# Patient Record
Sex: Male | Born: 1995 | Race: White | Hispanic: No | Marital: Single | Smoking: Current every day smoker
Health system: Southern US, Community
[De-identification: ages and names within clinical notes are randomized; demographics above are authoritative.]

## PROBLEM LIST (undated history)

## (undated) DIAGNOSIS — F32A Depression, unspecified: Secondary | ICD-10-CM

## (undated) DIAGNOSIS — F419 Anxiety disorder, unspecified: Secondary | ICD-10-CM

## (undated) HISTORY — DX: Depression, unspecified: F32.A

## (undated) HISTORY — DX: Anxiety disorder, unspecified: F41.9

---
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
## 2016-04-18 ENCOUNTER — Encounter: Payer: Self-pay | Admitting: Emergency Medicine

## 2016-04-18 ENCOUNTER — Emergency Department
Admission: EM | Admit: 2016-04-18 | Discharge: 2016-04-18 | Disposition: A | Discharge status: Home / Self Care | Payer: Self-pay | Attending: Student | Admitting: Student

## 2016-04-18 DIAGNOSIS — R21 Rash and other nonspecific skin eruption: Secondary | ICD-10-CM | POA: Insufficient documentation

## 2016-04-18 MED ORDER — TRIAMCINOLONE ACETONIDE 0.5 % EX OINT: 1.0000 "application " | TOPICAL_OINTMENT | Freq: Two times a day (BID) | CUTANEOUS | 0 refills | Status: DC

## 2016-04-18 NOTE — ED Triage Notes (Signed)
Pt has rash on both arms. States no one else in household has rash. Slight itch, no pain

## 2016-04-18 NOTE — ED Provider Notes (Signed)
[facility] Emergency Department Provider Note   ____________________________________________   First MD Initiated Contact with Patient 04/18/16 1419     (approximate)  I have reviewed the triage vital signs and the nursing notes.   HISTORY  Chief Complaint Rash    HPI Ricardo Turner is a 20 y.o. male is here with complaint of rashthat started on his arms, days ago. Patient states there is a slight itch but not severe. He has not used any over-the-counter medications for his rash. He states no one else in the family has a rash. He is unaware of any contact or exposures to allergens. There is family pets but no known flea problems. Patient denies any tick bites. He denies fever and chills. Patient denies any yard work where he may be exposed to poison oak or poison ivy. Patient denies any pain.   History reviewed. No pertinent past medical history.  There are no active problems to display for this patient.   History reviewed. No pertinent surgical history.  Prior to Admission medications   Medication Sig Start Date End Date Taking? Authorizing Provider  triamcinolone ointment (KENALOG) 0.5 % Apply 1 application topically 2 (two) times daily. 04/18/16   Tommi Rumps, PA-C    Allergies Review of patient's allergies indicates no known allergies.  History reviewed. No pertinent family history.  Social History Social History  Substance Use Topics  . Smoking status: Never Smoker  . Smokeless tobacco: Never Used  . Alcohol use No    Review of Systems Constitutional: No fever/chills ENT: No sore throat. Cardiovascular: Denies chest pain. Respiratory: Denies shortness of breath. Gastrointestinal:   No nausea, no vomiting.  Musculoskeletal: Negative for back pain. Skin: Positive for skin rash. Neurological: Negative for headaches, focal weakness or numbness.  10-point ROS otherwise  negative.  ____________________________________________   PHYSICAL EXAM:  VITAL SIGNS: ED Triage Vitals  Enc Vitals Group     BP 04/18/16 1338 (!) 158/90     Pulse Rate 04/18/16 1338 75     Resp 04/18/16 1338 16     Temp 04/18/16 1338 98.3 F (36.8 C)     Temp src --      SpO2 04/18/16 1338 98 %     Weight 04/18/16 1339 169 lb (76.7 kg)     Height 04/18/16 1339 6\' 2"  (1.88 m)     Head Circumference --      Peak Flow --      Pain Score --      Pain Loc --      Pain Edu? --      Excl. in GC? --     Constitutional: Alert and oriented. Well appearing and in no acute distress. Eyes: Conjunctivae are normal. PERRL. EOMI. Head: Atraumatic. Nose: No congestion/rhinnorhea. Neck: No stridor.   Cardiovascular: Normal rate, regular rhythm. Grossly normal heart sounds.  Good peripheral circulation. Respiratory: Normal respiratory effort.  No retractions. Lungs CTAB. Musculoskeletal: Moves upper and lower extremities without any difficulty. Normal gait was noted. Neurologic:  Normal speech and language. No gross focal neurologic deficits are appreciated. No gait instability. Skin:  Skin is warm, dry and intact. There is an erythematous, nontender papular rash bilateral forearms that stops sharply at the edge of patient's T-shirt. There is no involvement of the torso or lower extremities. There is no pustules or vesicles noted. There is no pattern to the patient's rash. Each area appears to be individual papules and no warmth was noted. Psychiatric:  Mood and affect are normal. Speech and behavior are normal.  ____________________________________________   LABS (all labs ordered are listed, but only abnormal results are displayed)  Labs Reviewed - No data to display  PROCEDURES  Procedure(s) performed: None  Procedures  Critical Care performed: No  ____________________________________________   INITIAL IMPRESSION / ASSESSMENT AND PLAN / ED COURSE  Pertinent labs & imaging  results that were available during my care of the patient were reviewed by me and considered in my medical decision making (see chart for details).    Clinical Course   Patient was given a prescription for Kenalog cream to apply sparingly to the areas for twice a days. Patient may also take oral Benadryl if needed for itching. Patient is follow-up with [facility] if any continued problems.  ____________________________________________   FINAL CLINICAL IMPRESSION(S) / ED DIAGNOSES  Final diagnoses:  Rash and nonspecific skin eruption      NEW MEDICATIONS STARTED DURING THIS VISIT:  Discharge Medication List as of 04/18/2016  2:34 PM    START taking these medications   Details  triamcinolone ointment (KENALOG) 0.5 % Apply 1 application topically 2 (two) times daily., Starting Sun 04/18/2016, Print         Note:  This document was prepared using Dragon voice recognition software and may include unintentional dictation errors.    Tommi Rumpshonda L Summers, PA-C 04/18/16 1442    Gayla DossEryka A Gayle, MD 04/18/16 [phone]

## 2016-04-18 NOTE — Discharge Instructions (Signed)
Begin using a thin layer of cream to your arms twice a day. You may also take Benadryl by mouth if needed for itching. Follow-up with Dr. Gwen [facility]oundsKowalski at [facility] if any continued problems.

## 2017-08-06 ENCOUNTER — Emergency Department
Admission: EM | Admit: 2017-08-06 | Discharge: 2017-08-06 | Disposition: A | Discharge status: Home / Self Care | Payer: No Typology Code available for payment source | Attending: Emergency Medicine | Admitting: Emergency Medicine

## 2017-08-06 ENCOUNTER — Encounter: Payer: Self-pay | Admitting: Emergency Medicine

## 2017-08-06 ENCOUNTER — Emergency Department: Payer: No Typology Code available for payment source

## 2017-08-06 DIAGNOSIS — S161XXA Strain of muscle, fascia and tendon at neck level, initial encounter: Secondary | ICD-10-CM | POA: Diagnosis not present

## 2017-08-06 DIAGNOSIS — Y929 Unspecified place or not applicable: Secondary | ICD-10-CM | POA: Insufficient documentation

## 2017-08-06 DIAGNOSIS — Y999 Unspecified external cause status: Secondary | ICD-10-CM | POA: Diagnosis not present

## 2017-08-06 DIAGNOSIS — F17200 Nicotine dependence, unspecified, uncomplicated: Secondary | ICD-10-CM | POA: Diagnosis not present

## 2017-08-06 DIAGNOSIS — S40212A Abrasion of left shoulder, initial encounter: Secondary | ICD-10-CM | POA: Diagnosis not present

## 2017-08-06 DIAGNOSIS — Y939 Activity, unspecified: Secondary | ICD-10-CM | POA: Diagnosis not present

## 2017-08-06 DIAGNOSIS — S199XXA Unspecified injury of neck, initial encounter: Secondary | ICD-10-CM | POA: Diagnosis present

## 2017-08-06 DIAGNOSIS — Z79899 Other long term (current) drug therapy: Secondary | ICD-10-CM | POA: Insufficient documentation

## 2017-08-06 MED ORDER — CYCLOBENZAPRINE HCL 10 MG PO TABS
10.0000 mg | ORAL_TABLET | Freq: Once | ORAL | Status: AC
  Administered 2017-08-06: 10 mg via ORAL
  Filled 2017-08-06: qty 1

## 2017-08-06 MED ORDER — CYCLOBENZAPRINE HCL 5 MG PO TABS: 5.0000 mg | ORAL_TABLET | Freq: Three times a day (TID) | ORAL | 0 refills | Status: DC | PRN

## 2017-08-06 MED ORDER — NAPROXEN 500 MG PO TABS: 500.0000 mg | ORAL_TABLET | Freq: Two times a day (BID) | ORAL | 0 refills | Status: AC

## 2017-08-06 MED ORDER — NAPROXEN 500 MG PO TABS
500.0000 mg | ORAL_TABLET | Freq: Once | ORAL | Status: AC
  Administered 2017-08-06: 500 mg via ORAL
  Filled 2017-08-06: qty 1
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
## 2017-08-06 NOTE — Discharge Instructions (Signed)
Your exam and x-rays are normal at this time. There is no indication of a serious injury or spinal fracture. Take the prescription meds as directed. Apply ice to any sore muscles. Follow-up with your provider or [facility] for continued symptoms.
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
## 2019-07-27 ENCOUNTER — Encounter: Payer: Self-pay | Admitting: Emergency Medicine

## 2019-07-27 ENCOUNTER — Other Ambulatory Visit: Payer: Self-pay

## 2019-07-27 ENCOUNTER — Ambulatory Visit
Admission: EM | Admit: 2019-07-27 | Discharge: 2019-07-27 | Disposition: A | Discharge status: Home / Self Care | Payer: Medicaid Other | Attending: Emergency Medicine | Admitting: Emergency Medicine

## 2019-07-27 DIAGNOSIS — B354 Tinea corporis: Secondary | ICD-10-CM

## 2019-07-27 MED ORDER — KETOCONAZOLE 2 % EX CREA: 1.0000 "application " | TOPICAL_CREAM | Freq: Every day | CUTANEOUS | 0 refills | Status: DC

## 2019-07-27 NOTE — ED Notes (Signed)
Updated on delays for room

## 2019-07-27 NOTE — Discharge Instructions (Addendum)
Use cream instructed. May use antifungal shampoo such as Selsun Blue for additional relief. Please see attached information for more information about ringworm. Please return for worsening rash, pain, fever.

## 2019-07-27 NOTE — ED Triage Notes (Signed)
  Pt presents to [facility] for assessment of 1.5 weeks of ringworm-like rash to left thumb, right forearm, neck, and back.

## 2019-07-27 NOTE — ED Provider Notes (Signed)
[facility]    CSN: [id number] Arrival date & time: 07/27/19  1122      History   Chief Complaint Chief Complaint  Patient presents with  . Rash    HPI Ricardo Turner is a 24 y.o. male presenting for 1.5-week of pruritic rash over left thumb, right forearm, neck.  States he had some lesions on his back, but those resolved.  Denies fever, arthralgias, myalgias.  No known plant exposure, bug bite.  Has tried Lotrimin without significant relief.   History reviewed. No pertinent past medical history.  There are no problems to display for this patient.   History reviewed. No pertinent surgical history.     Home Medications    Prior to Admission medications   Medication Sig Start Date End Date Taking? Authorizing Provider  ketoconazole (NIZORAL) 2 % cream Apply 1 application topically daily. 07/27/19   Hall-Potvin, [place], PA-C    Family History Family History  Problem Relation Age of Onset  . Healthy Mother   . Healthy Father     Social History Social History   Tobacco Use  . Smoking status: Current Some Day Smoker  . Smokeless tobacco: Never Used  Substance Use Topics  . Alcohol use: No  . Drug use: No     Allergies   Patient has no known allergies.   Review of Systems Review of Systems  Constitutional: Negative for fatigue and fever.  Respiratory: Negative for cough and shortness of breath.   Cardiovascular: Negative for chest pain and palpitations.  Gastrointestinal: Negative for abdominal pain, diarrhea and vomiting.  Musculoskeletal: Negative for arthralgias and myalgias.  Skin: Positive for rash. Negative for wound.  Neurological: Negative for speech difficulty and headaches.  All other systems reviewed and are negative.    Physical Exam Triage Vital Signs ED Triage Vitals [07/27/19 1227]  Enc Vitals Group     BP 132/88     Pulse Rate 81     Resp 16     Temp 98 F (36.7 C)     Temp Source Temporal     SpO2 98 %   Weight      Height      Head Circumference      Peak Flow      Pain Score 0     Pain Loc      Pain Edu?      Excl. in GC?    No data found.  Updated Vital Signs BP 132/88 (BP Location: Right Arm)   Pulse 81   Temp 98 F (36.7 C) (Temporal)   Resp 16   SpO2 98%   Visual Acuity Right Eye Distance:   Left Eye Distance:   Bilateral Distance:    Right Eye Near:   Left Eye Near:    Bilateral Near:     Physical Exam Constitutional:      General: He is not in acute distress.    Appearance: He is normal weight. He is not ill-appearing.  HENT:     Head: Normocephalic and atraumatic.  Eyes:     General: No scleral icterus.    Pupils: Pupils are equal, round, and reactive to light.  Cardiovascular:     Rate and Rhythm: Normal rate.  Pulmonary:     Effort: Pulmonary effort is normal. No respiratory distress.     Breath sounds: No wheezing.  Musculoskeletal:     Cervical back: No tenderness.  Lymphadenopathy:     Cervical: No cervical adenopathy.  Skin:    Coloration: Skin is not jaundiced or pale.     Findings: Rash present.     Comments: Scant, scattered circumferential erythematous lesions that appear dry, with central clearing.  No open wound, active discharge.  Lesions are nontender.  Lesions noted over left thumb, right forearm, neck.    Neurological:     Mental Status: He is alert and oriented to person, place, and time.      UC Treatments / Results  Labs (all labs ordered are listed, but only abnormal results are displayed) Labs Reviewed - No data to display  EKG   Radiology No results found.  Procedures Procedures (including critical care time)  Medications Ordered in UC Medications - No data to display  Initial Impression / Assessment and Plan / UC Course  I have reviewed the triage vital signs and the nursing notes.  Pertinent labs & imaging results that were available during my care of the patient were reviewed by me and considered in my  medical decision making (see chart for details).     H&P consistent with ringworm: We will increase strength of antifungal-ketoconazole sent.  Reviewed additional supportive treatment as outlined below and patient education material.  Return precautions discussed, patient verbalized understanding and is agreeable to plan. Final Clinical Impressions(s) / UC Diagnoses   Final diagnoses:  Ringworm of body     Discharge Instructions     Use cream instructed. May use antifungal shampoo such as Selsun Blue for additional relief. Please see attached information for more information about ringworm. Please return for worsening rash, pain, fever.    ED Prescriptions    Medication Sig Dispense Auth. Provider   ketoconazole (NIZORAL) 2 % cream Apply 1 application topically daily. 15 g Hall-Potvin, [place], PA-C     PDMP not reviewed this encounter.   Hall-Potvin, [place], [place] 07/27/19 1611

## 2023-02-18 ENCOUNTER — Ambulatory Visit
Admission: EM | Admit: 2023-02-18 | Discharge: 2023-02-18 | Disposition: A | Discharge status: Home / Self Care | Payer: BC Managed Care – PPO | Attending: Emergency Medicine | Admitting: Emergency Medicine

## 2023-02-18 DIAGNOSIS — U071 COVID-19: Secondary | ICD-10-CM | POA: Insufficient documentation

## 2023-02-18 DIAGNOSIS — B349 Viral infection, unspecified: Secondary | ICD-10-CM | POA: Insufficient documentation

## 2023-02-18 DIAGNOSIS — R509 Fever, unspecified: Secondary | ICD-10-CM | POA: Diagnosis present

## 2023-02-18 DIAGNOSIS — Z87891 Personal history of nicotine dependence: Secondary | ICD-10-CM | POA: Diagnosis not present

## 2023-02-18 DIAGNOSIS — J029 Acute pharyngitis, unspecified: Secondary | ICD-10-CM | POA: Diagnosis present

## 2023-02-18 LAB — POCT RAPID STREP A (OFFICE): Rapid Strep A Screen: NEGATIVE

## 2023-02-18 NOTE — Discharge Instructions (Addendum)
The strep test is negative.  Your COVID test is pending.    Take Tylenol as needed for fever or discomfort.  Rest and keep yourself hydrated.    Follow-up with your primary care provider if your symptoms are not improving.

## 2023-02-18 NOTE — ED Provider Notes (Signed)
Ricardo Turner    CSN: [id number] Arrival date & time: 02/18/23  1059      History   Chief Complaint Chief Complaint  Patient presents with   Sore Throat    HPI Ricardo Turner is a 27 y.o. male.  Patient presents with 1 day history of fever, body aches, sore throat, runny nose, mild cough.  Tmax 102.  He took DayQuil this morning.  No rash, shortness of breath, vomiting, diarrhea, or other symptoms.  No pertinent medical history.  The history is provided by the patient and medical records.    History reviewed. No pertinent past medical history.  There are no problems to display for this patient.   History reviewed. No pertinent surgical history.     Home Medications    Prior to Admission medications   Medication Sig Start Date End Date Taking? Authorizing Provider  ketoconazole (NIZORAL) 2 % cream Apply 1 application topically daily. 07/27/19   Hall-Potvin, [place], PA-C    Family History Family History  Problem Relation Age of Onset   Healthy Mother    Healthy Father     Social History Social History   Tobacco Use   Smoking status: Former    Types: Cigarettes   Smokeless tobacco: Never  Vaping Use   Vaping status: Every Day  Substance Use Topics   Alcohol use: No   Drug use: No     Allergies   Patient has no known allergies.   Review of Systems Review of Systems  Constitutional:  Positive for fever. Negative for chills.  HENT:  Positive for rhinorrhea and sore throat. Negative for ear pain.   Respiratory:  Positive for cough. Negative for shortness of breath.   Cardiovascular:  Negative for chest pain and palpitations.  Gastrointestinal:  Negative for diarrhea and vomiting.     Physical Exam Triage Vital Signs ED Triage Vitals  Encounter Vitals Group     BP 02/18/23 1134 (!) 147/88     Systolic BP Percentile --      Diastolic BP Percentile --      Pulse Rate 02/18/23 1124 (!) 118     Resp 02/18/23 1124 18     Temp 02/18/23  1134 99.5 F (37.5 C)     Temp src --      SpO2 02/18/23 1124 96 %     Weight --      Height --      Head Circumference --      Peak Flow --      Pain Score 02/18/23 1127 5     Pain Loc --      Pain Education --      Exclude from Growth Chart --    No data found.  Updated Vital Signs BP (!) 147/88   Pulse (!) 118   Temp 99.5 F (37.5 C)   Resp 18   SpO2 96%   Visual Acuity Right Eye Distance:   Left Eye Distance:   Bilateral Distance:    Right Eye Near:   Left Eye Near:    Bilateral Near:     Physical Exam Vitals and nursing note reviewed.  Constitutional:      General: He is not in acute distress.    Appearance: He is well-developed.  HENT:     Right Ear: Tympanic membrane normal.     Left Ear: Tympanic membrane normal.     Nose: Nose normal.     Mouth/Throat:  Mouth: Mucous membranes are moist.     Pharynx: Oropharynx is clear.  Cardiovascular:     Rate and Rhythm: Normal rate and regular rhythm.     Heart sounds: Normal heart sounds.  Pulmonary:     Effort: Pulmonary effort is normal. No respiratory distress.     Breath sounds: Normal breath sounds.  Musculoskeletal:     Cervical back: Neck supple.  Skin:    General: Skin is warm and dry.  Neurological:     Mental Status: He is alert.  Psychiatric:        Mood and Affect: Mood normal.        Behavior: Behavior normal.      UC Treatments / Results  Labs (all labs ordered are listed, but only abnormal results are displayed) Labs Reviewed  SARS CORONAVIRUS 2 (TAT 6-24 HRS)  POCT RAPID STREP A (OFFICE)    EKG   Radiology No results found.  Procedures Procedures (including critical care time)  Medications Ordered in UC Medications - No data to display  Initial Impression / Assessment and Plan / UC Course  I have reviewed the triage vital signs and the nursing notes.  Pertinent labs & imaging results that were available during my care of the patient were reviewed by me and  considered in my medical decision making (see chart for details).    Viral illness.  Rapid strep negative.  COVID pending.  If positive for COVID, recommend symptomatic treatment.  Discussed symptomatic treatment including Tylenol, rest, hydration.  Instructed patient to follow up with PCP if symptoms are not improving.  He agrees to plan of care.   Final Clinical Impressions(s) / UC Diagnoses   Final diagnoses:  Viral illness     Discharge Instructions      The strep test is negative.  Your COVID test is pending.    Take Tylenol as needed for fever or discomfort.  Rest and keep yourself hydrated.    Follow-up with your primary care provider if your symptoms are not improving.         ED Prescriptions   None    PDMP not reviewed this encounter.   Mickie Bail, NP 02/18/23 1151

## 2023-02-18 NOTE — ED Triage Notes (Signed)
Patient to Urgent Care with complaints of a sore throat/ fevers/ generalized body aches that started yesterday.  Has been taking Nyquil/ Dayquil/ ibuprofen. Max temp 102-103.

## 2023-11-17 ENCOUNTER — Ambulatory Visit: Admitting: Physician Assistant

## 2024-02-17 ENCOUNTER — Encounter: Payer: Self-pay | Admitting: Family Medicine

## 2024-02-17 ENCOUNTER — Ambulatory Visit (INDEPENDENT_AMBULATORY_CARE_PROVIDER_SITE_OTHER): Admitting: Family Medicine

## 2024-02-17 VITALS — BP 140/92 | HR 86 | Temp 98.2°F | Ht 69.69 in | Wt 212.4 lb

## 2024-02-17 DIAGNOSIS — E6609 Other obesity due to excess calories: Secondary | ICD-10-CM

## 2024-02-17 DIAGNOSIS — Z720 Tobacco use: Secondary | ICD-10-CM

## 2024-02-17 DIAGNOSIS — Z833 Family history of diabetes mellitus: Secondary | ICD-10-CM

## 2024-02-17 DIAGNOSIS — Z7689 Persons encountering health services in other specified circumstances: Secondary | ICD-10-CM

## 2024-02-17 DIAGNOSIS — E66811 Obesity, class 1: Secondary | ICD-10-CM

## 2024-02-17 DIAGNOSIS — Z1159 Encounter for screening for other viral diseases: Secondary | ICD-10-CM | POA: Diagnosis not present

## 2024-02-17 DIAGNOSIS — Z6830 Body mass index (BMI) 30.0-30.9, adult: Secondary | ICD-10-CM

## 2024-02-17 DIAGNOSIS — R030 Elevated blood-pressure reading, without diagnosis of hypertension: Secondary | ICD-10-CM | POA: Diagnosis not present

## 2024-02-17 DIAGNOSIS — R2232 Localized swelling, mass and lump, left upper limb: Secondary | ICD-10-CM

## 2024-02-17 DIAGNOSIS — R221 Localized swelling, mass and lump, neck: Secondary | ICD-10-CM

## 2024-02-17 NOTE — Progress Notes (Signed)
 New Patient Office Visit  Introduced to nurse practitioner role and practice setting.  All questions answered.  Discussed provider/patient relationship and expectations.  Subjective    Patient ID: Ricardo Turner, male    DOB: 06/21/1996  Age: 28 y.o. MRN: [id number]  CC:  Chief Complaint  Patient presents with   Establish Care    Diet: Normal, does not feel like he eats that great. A lot of fast food in diet Exercise: None  Sleep: Could be better  Overall feeling: okay, could be better  Vaccines- Please discuss need for Tdap, Hep B and Prevnar20 Concerns of family history of htn in father, patient states he does not feel like he eats well and he also smokes.     HPI  Discussed the use of AI scribe software for clinical note transcription with the patient, who gave verbal consent to proceed.  History of Present Illness Ricardo Turner is a 28 year old male who presents with concerns about a lump under the skin, L neck mass, and elevated blood pressure.  Approximately two to three weeks ago, he noticed a lump under the skin of his arm. Initially, the arm felt achy and sore for a couple of days, but there has been no pain since. The lump remains present, and the patient reports no pain since the initial soreness.  He also observes that his neck appears slightly more prominent on one side, which he attributes to possibly manipulating it. There is no associated pain, but he is concerned about the asymmetry.  He has a history of anxiety and depression but is not currently on any medications. His family history includes a father with hypertension, a mother with mood disorders including anxiety and depression, and a history of diabetes and cancer, including lung cancer in a grandparent.  Socially, he is engaged, has a three-year-old son, works in International aid/development worker rebuild, and resides in [place]. He uses a vape with nicotine daily, especially when at home, and is attempting to reduce his  usage. He does not use tobacco products.  He reports occasional headaches and transient blurry vision. No chest pain. He acknowledges that his diet is not the healthiest.  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   No facility-administered encounter medications on file as of 02/17/2024.    Past Medical History:  Diagnosis Date   Anxiety    Depression     History reviewed. No pertinent surgical history.  Family History  Problem Relation Age of Onset   Depression Mother    Anxiety disorder Mother    Hypertension Father    Diabetes Paternal Aunt    Diabetes Paternal Grandmother     Social History   Socioeconomic History   Marital status: Single    Spouse name: Not on file   Number of children: Not on file   Years of education: Not on  file   Highest education level: 12th grade  Occupational History   Not on file  Tobacco Use   Smoking status: Every Day    Types: E-cigarettes   Smokeless tobacco: Never  Vaping Use   Vaping status: Every Day   Start date: 07/26/2017  Substance and Sexual Activity   Alcohol use: No   Drug use: No   Sexual activity: Yes    Birth control/protection: None  Other Topics Concern   Not on file  Social History Narrative   Not on file   Social Drivers of Health   Financial Resource Strain: Low Risk  (02/16/2024)   Overall Financial Resource Strain (CARDIA)    Difficulty of Paying Living Expenses: Not  very hard  Food Insecurity: No Food Insecurity (02/16/2024)   Hunger Vital Sign    Worried About Running Out of Food in the Last Year: Never true    Ran Out of Food in the Last Year: Never true  Transportation Needs: No Transportation Needs (02/16/2024)   PRAPARE - Administrator, Civil Service (Medical): No    Lack of Transportation (Non-Medical): No  Physical Activity: Inactive (02/16/2024)   Exercise Vital Sign    Days of Exercise per Week: 0 days    Minutes of Exercise per Session: Not on file  Stress: Stress Concern Present (02/16/2024)   [employer] of Occupational Health - Occupational Stress Questionnaire    Feeling of Stress: To some extent  Social Connections: Moderately Isolated (02/16/2024)   Social Connection and Isolation Panel    Frequency of Communication with Friends and Family: More than three times a week    Frequency of Social Gatherings with Friends and Family: Once a week    Attends Religious Services: Never    Database administrator or Organizations: No    Attends Engineer, structural: Not on file    Marital Status: Living with partner  Intimate Partner Violence: Not At Risk (02/17/2024)   Humiliation, Afraid, Rape, and Kick questionnaire    Fear of Current or Ex-Partner: No    Emotionally Abused: No    Physically Abused: No    Sexually Abused: No    ROS      Objective    BP (!) 140/92 (BP Location: Left Arm, Patient Position: Sitting, Cuff Size: Normal) Comment: manual  Pulse 86   Temp 98.2 F (36.8 C) (Oral)   Ht 5' 9.69 (1.77 m)   Wt 212 lb 6.4 oz (96.3 kg)   SpO2 100%   BMI 30.75 kg/m   Physical Exam Constitutional:      General: He is not in acute distress.    Appearance: Normal appearance. He is well-developed. He is obese. He is not ill-appearing, toxic-appearing or diaphoretic.  HENT:     Head: Normocephalic.     Nose: Nose normal.     Mouth/Throat:     Mouth: Mucous membranes are moist.  Eyes:      Extraocular Movements: Extraocular movements intact.     Conjunctiva/sclera: Conjunctivae normal.     Pupils: Pupils are equal, round, and reactive to light.  Neck:     Thyroid: Thyroid mass present. No thyroid tenderness.     Trachea: Trachea normal.     Comments: L side of neck enlarged compared to R. Palpated thyroid, does not appear to have nodules to tactile. Cardiovascular:     Rate and Rhythm: Normal rate and regular rhythm.     Pulses: Normal pulses.  Heart sounds: Normal heart sounds. No murmur heard.    No friction rub. No gallop.  Pulmonary:     Effort: Pulmonary effort is normal. No respiratory distress.     Breath sounds: Normal breath sounds. No stridor. No wheezing, rhonchi or rales.  Chest:     Chest wall: No tenderness.  Musculoskeletal:     Cervical back: Full passive range of motion without pain, normal range of motion and neck supple. No edema, erythema, signs of trauma, rigidity, torticollis or crepitus. No pain with movement. Normal range of motion.     Right lower leg: No edema.     Left lower leg: No edema.  Lymphadenopathy:     Cervical:     Right cervical: No superficial, deep or posterior cervical adenopathy.    Left cervical: No superficial, deep or posterior cervical adenopathy.  Skin:    General: Skin is warm and dry.     Capillary Refill: Capillary refill takes less than 2 seconds.     Comments: L forearm anterior, medial, nodule superficial, subdermal. 0.25x0.25 cm. No non mobile, nontender, no erythema, no edema.  Neurological:     General: No focal deficit present.     Mental Status: He is alert and oriented to person, place, and time. Mental status is at baseline.     Cranial Nerves: No cranial nerve deficit.     Sensory: No sensory deficit.     Motor: No weakness.     Coordination: Coordination normal.     Gait: Gait normal.  Psychiatric:        Attention and Perception: Attention and perception normal.        Mood and Affect: Mood normal.  Affect is flat.        Speech: Speech normal.        Behavior: Behavior normal. Behavior is cooperative.        Thought Content: Thought content normal.        Cognition and Memory: Cognition and memory normal.        Judgment: Judgment normal.         Assessment & Plan:  Assessment and Plan Assessment & Plan Neck swelling/ L neck lump Slight unilateral elevation of L side of neck,without nodules. Thyroid it self did not appear irregular in shape or size to palpation Differential includes thyroid enlargement or benign neck mass. - Order thyroid & soft tissue neckultrasound to evaluate swelling.  Nodule of skin, L forearm Non-painful, stable lump resembling scar tissue, superficial subdermal, size 0.25cm x 0.,25 cm.. Differential includes benign causes such as scar tissue or lipoma. - Monitor for changes in size or symptoms. - Consider ultrasound if changes occur.  Hypertension Elevated blood pressure at 140/92 mmHg. Family history present. Discussed risks of uncontrolled hypertension and emphasized lifestyle modifications. - Recommend lifestyle modifications: regular exercise, reduced sodium intake, decreased nicotine use. - Advise obtaining home blood pressure monitor for regular checks. GOAL<120/80 - Follow up in 4-6 weeks to reassess blood pressure and consider medication if needed.  BMI = 30.75 Continue to make conscious decisions for well balanced diet smaller portions with increase protein, fruits, veggies, water as drink of choice, decrease starches, processed foods, and saturated fats. Increase weekly exercise - 150 minutes per week.    Nicotine use w/ vape pen Daily nicotine use via vaping. Discussed health risks including effects on vasculature and blood pressure. - Encourage reduction  and cessation of nicotine use. - Discuss potential health risks of vaping.  General Health  Maintenance Up to date on hepatitis B vaccination. Discussed importance of Tdap  vaccination. - Recommend Tdap vaccination during next visit.  Follow-up Needs fasting labs and follow-up on blood pressure and other health concerns. - Return for fasting labs at convenience. - Follow up in 4-6 weeks to reassess blood pressure and review lab results.     Elevated blood pressure reading in office without diagnosis of hypertension -     Comprehensive metabolic panel with GFR -     TSH -     Lipid panel  Class 1 obesity due to excess calories without serious comorbidity with body mass index (BMI) of 30.0 to 30.9 in adult -     Comprehensive metabolic panel with GFR -     CBC -     TSH -     Lipid panel  Family history of diabetes mellitus in first degree relative -     Hemoglobin A1c  Screening for viral disease -     Hepatitis C antibody -     HIV Antibody (routine testing w rflx)  Mass of left side of neck -     US  SOFT TISSUE HEAD & NECK (NON-THYROID); Future -     US  THYROID; Future  Neck swelling -     US  SOFT TISSUE HEAD & NECK (NON-THYROID); Future -     US  THYROID; Future  Vapes nicotine containing substance  Encounter to establish care with new doctor  Nodule of skin of left forearm    Return in about 4 weeks (around 03/16/2024) for BP check - 4-6 weeks.   I, Curtis DELENA Boom, FNP, have reviewed all documentation for this visit. The documentation on 02/17/24 for the exam, diagnosis, procedures, and orders are all accurate and complete.   Curtis DELENA Boom, FNP

## 2024-02-24 ENCOUNTER — Ambulatory Visit

## 2024-03-02 ENCOUNTER — Ambulatory Visit: Payer: Self-pay | Admitting: Family Medicine

## 2024-03-02 ENCOUNTER — Ambulatory Visit
Admission: RE | Admit: 2024-03-02 | Discharge: 2024-03-02 | Disposition: A | Discharge status: Home / Self Care | Source: Ambulatory Visit | Attending: Family Medicine | Admitting: Family Medicine

## 2024-03-02 ENCOUNTER — Other Ambulatory Visit: Payer: Self-pay | Admitting: Family Medicine

## 2024-03-02 DIAGNOSIS — R221 Localized swelling, mass and lump, neck: Secondary | ICD-10-CM | POA: Diagnosis present

## 2024-03-02 DIAGNOSIS — R2232 Localized swelling, mass and lump, left upper limb: Secondary | ICD-10-CM | POA: Insufficient documentation

## 2024-03-16 ENCOUNTER — Ambulatory Visit: Payer: Self-pay | Admitting: Family Medicine

## 2024-05-20 ENCOUNTER — Encounter: Payer: Self-pay | Admitting: Emergency Medicine

## 2024-05-20 ENCOUNTER — Ambulatory Visit
Admission: EM | Admit: 2024-05-20 | Discharge: 2024-05-20 | Disposition: A | Discharge status: Home / Self Care | Attending: Emergency Medicine | Admitting: Emergency Medicine

## 2024-05-20 DIAGNOSIS — J029 Acute pharyngitis, unspecified: Secondary | ICD-10-CM | POA: Insufficient documentation

## 2024-05-20 DIAGNOSIS — J039 Acute tonsillitis, unspecified: Secondary | ICD-10-CM | POA: Diagnosis present

## 2024-05-20 LAB — POCT RAPID STREP A (OFFICE): Rapid Strep A Screen: NEGATIVE

## 2024-05-20 MED ORDER — AMOXICILLIN-POT CLAVULANATE 875-125 MG PO TABS: 1.0000 | ORAL_TABLET | Freq: Two times a day (BID) | ORAL | 0 refills | Status: AC

## 2024-05-20 NOTE — Discharge Instructions (Signed)
 Your rapid strep test today was negative, swab has been sent to the lab to see if it will grow bacteria you will be notified if this occurs, based on the appearance of your throat you will be started on antibiotics as a precaution  Take Augmentin twice a day for the next 7 days, daily will see improvement in about 48 hours and steady progression from there  may use of salt gargles throat lozenges, warm liquids, teaspoons of honey and over-the-counter clippers septic spray for comfort  May give Tylenol or Motrin every 6 hours as needed for additional comfort  You may follow-up at urgent care as needed

## 2024-05-20 NOTE — ED Triage Notes (Signed)
 Patient reports sore throat for 2 days. Patient has been taking Dayquil/Nyquil and Ibuprofen with some relief. Rates pain 5/10.

## 2024-05-20 NOTE — ED Provider Notes (Signed)
 [facility]    CSN: [id number] Arrival date & time: 05/20/24  1108      History   Chief Complaint Chief Complaint  Patient presents with   Sore Throat    HPI MICKY SHELLER is a 28 y.o. male.   Patient presents for evaluation of nasal congestion, nonproductive cough, sore throat and intermittent headaches beginning 2 days ago.  Congestion and headaches have resolved.  No known sick contacts.  Tolerable to food and liquids.  Has attempted use of DayQuil NyQuil and ibuprofen.   Past Medical History:  Diagnosis Date   Anxiety    Depression     There are no active problems to display for this patient.   History reviewed. No pertinent surgical history.     Home Medications    Prior to Admission medications   Medication Sig Start Date End Date Taking? Authorizing Provider  amoxicillin-clavulanate (AUGMENTIN) 875-125 MG tablet Take 1 tablet by mouth every 12 (twelve) hours. 05/20/24  Yes Esmeralda Blanford, Shelba SAUNDERS, NP    Family History Family History  Problem Relation Age of Onset   Depression Mother    Anxiety disorder Mother    Hypertension Father    Diabetes Paternal Aunt    Diabetes Paternal Grandmother     Social History Social History   Tobacco Use   Smoking status: Every Day    Types: E-cigarettes   Smokeless tobacco: Never  Vaping Use   Vaping status: Every Day   Start date: 07/26/2017  Substance Use Topics   Alcohol use: No   Drug use: No     Allergies   Patient has no known allergies.   Review of Systems Review of Systems   Physical Exam Triage Vital Signs ED Triage Vitals  Encounter Vitals Group     BP 05/20/24 1128 (!) 143/85     Girls Systolic BP Percentile --      Girls Diastolic BP Percentile --      Boys Systolic BP Percentile --      Boys Diastolic BP Percentile --      Pulse Rate 05/20/24 1128 84     Resp 05/20/24 1128 20     Temp 05/20/24 1128 98.7 F (37.1 C)     Temp Source 05/20/24 1128 Oral     SpO2 05/20/24  1128 97 %     Weight --      Height --      Head Circumference --      Peak Flow --      Pain Score 05/20/24 1130 5     Pain Loc --      Pain Education --      Exclude from Growth Chart --    No data found.  Updated Vital Signs BP (!) 143/85 (BP Location: Left Arm)   Pulse 84   Temp 98.7 F (37.1 C) (Oral)   Resp 20   SpO2 97%   Visual Acuity Right Eye Distance:   Left Eye Distance:   Bilateral Distance:    Right Eye Near:   Left Eye Near:    Bilateral Near:     Physical Exam Constitutional:      Appearance: Normal appearance.  HENT:     Head: Normocephalic.     Right Ear: Tympanic membrane, ear canal and external ear normal.     Left Ear: Tympanic membrane, ear canal and external ear normal.     Nose: Congestion present.     Mouth/Throat:  Mouth: Mucous membranes are moist.     Pharynx: Oropharyngeal exudate and posterior oropharyngeal erythema present.     Tonsils: Tonsillar exudate present. 2+ on the right. 2+ on the left.  Eyes:     Extraocular Movements: Extraocular movements intact.  Cardiovascular:     Rate and Rhythm: Normal rate and regular rhythm.     Pulses: Normal pulses.     Heart sounds: Normal heart sounds.  Pulmonary:     Effort: Pulmonary effort is normal.     Breath sounds: Normal breath sounds.  Lymphadenopathy:     Cervical: No cervical adenopathy.  Neurological:     Mental Status: He is alert and oriented to person, place, and time. Mental status is at baseline.      UC Treatments / Results  Labs (all labs ordered are listed, but only abnormal results are displayed) Labs Reviewed  POCT RAPID STREP A (OFFICE) - Normal  CULTURE, GROUP A STREP [facility])    EKG   Radiology No results found.  Procedures Procedures (including critical care time)  Medications Ordered in UC Medications - No data to display  Initial Impression / Assessment and Plan / UC Course  I have reviewed the triage vital signs and the nursing  notes.  Pertinent labs & imaging results that were available during my care of the patient were reviewed by me and considered in my medical decision making (see chart for details).  Acute tonsillitis, sore throat  Patient is in no signs of distress nor toxic appearing.  Vital signs are stable.  Low suspicion for pneumonia, pneumothorax or bronchitis and therefore will defer imaging.  Rapid strep test negative, sent for culture.  Significant tonsillar adenopathy erythema and exudate present on exam empirically placed on Augmentin.May use additional over-the-counter medications as needed for supportive care.  May follow-up with urgent care as needed if symptoms persist or worsen.  Note given.   Final Clinical Impressions(s) / UC Diagnoses   Final diagnoses:  Sore throat     Discharge Instructions      Your rapid strep test today was negative, swab has been sent to the lab to see if it will grow bacteria you will be notified if this occurs, based on the appearance of your throat you will be started on antibiotics as a precaution  Take Augmentin twice a day for the next 7 days, daily will see improvement in about 48 hours and steady progression from there  may use of salt gargles throat lozenges, warm liquids, teaspoons of honey and over-the-counter clippers septic spray for comfort  May give Tylenol or Motrin every 6 hours as needed for additional comfort  You may follow-up at urgent care as needed      ED Prescriptions     Medication Sig Dispense Auth. Provider   amoxicillin-clavulanate (AUGMENTIN) 875-125 MG tablet Take 1 tablet by mouth every 12 (twelve) hours. 14 tablet Asuncion Tapscott R, NP      PDMP not reviewed this encounter.   Teresa Shelba SAUNDERS, NP 05/20/24 1156

## 2024-05-23 ENCOUNTER — Ambulatory Visit (HOSPITAL_COMMUNITY): Payer: Self-pay

## 2024-05-23 LAB — CULTURE, GROUP A STREP (THRC)
